# Patient Record
Sex: Male | Born: 1990 | Race: Black or African American | Hispanic: No | Marital: Single | Smoking: Former smoker
Health system: Southern US, Community
[De-identification: ages and names within clinical notes are randomized; demographics above are authoritative.]

## PROBLEM LIST (undated history)

## (undated) ENCOUNTER — Ambulatory Visit: Payer: Medicaid Other | Source: Home / Self Care

## (undated) DIAGNOSIS — T148XXA Other injury of unspecified body region, initial encounter: Secondary | ICD-10-CM

## (undated) DIAGNOSIS — Z789 Other specified health status: Secondary | ICD-10-CM

## (undated) HISTORY — DX: Other injury of unspecified body region, initial encounter: T14.8XXA

## (undated) HISTORY — PX: NO PAST SURGERIES: SHX2092

---
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
## 2010-05-04 ENCOUNTER — Emergency Department (HOSPITAL_COMMUNITY): Admission: EM | Admit: 2010-05-04 | Discharge: 2010-05-04 | Payer: Self-pay | Admitting: Emergency Medicine

## 2010-10-20 ENCOUNTER — Emergency Department (HOSPITAL_COMMUNITY)
Admission: EM | Admit: 2010-10-20 | Discharge: 2010-10-20 | Payer: Self-pay | Source: Home / Self Care | Admitting: Emergency Medicine

## 2011-01-27 LAB — POCT I-STAT, CHEM 8
BUN: 8 mg/dL (ref 6–23)
Calcium, Ion: 1.15 mmol/L (ref 1.12–1.32)
Chloride: 104 mEq/L (ref 96–112)
Creatinine, Ser: 1 mg/dL (ref 0.4–1.5)
Glucose, Bld: 84 mg/dL (ref 70–99)
HCT: 45 % (ref 39.0–52.0)
Hemoglobin: 15.3 g/dL (ref 13.0–17.0)
Potassium: 3.8 mEq/L (ref 3.5–5.1)
Sodium: 140 mEq/L (ref 135–145)
TCO2: 27 mmol/L (ref 0–100)

## 2011-04-01 ENCOUNTER — Emergency Department (HOSPITAL_COMMUNITY)
Admission: EM | Admit: 2011-04-01 | Discharge: 2011-04-01 | Disposition: A | Discharge status: Home / Self Care | Payer: Medicaid Other | Attending: Emergency Medicine | Admitting: Emergency Medicine

## 2011-04-01 DIAGNOSIS — S01501A Unspecified open wound of lip, initial encounter: Secondary | ICD-10-CM | POA: Insufficient documentation

## 2011-04-01 DIAGNOSIS — Y9361 Activity, american tackle football: Secondary | ICD-10-CM | POA: Insufficient documentation

## 2011-04-01 DIAGNOSIS — W219XXA Striking against or struck by unspecified sports equipment, initial encounter: Secondary | ICD-10-CM | POA: Insufficient documentation

## 2011-04-01 DIAGNOSIS — K1370 Unspecified lesions of oral mucosa: Secondary | ICD-10-CM | POA: Insufficient documentation
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
## 2011-04-04 NOTE — Discharge Summary (Signed)
Oscar Guerrero, DEVONSHIRE NO.:  [id number]   MEDICAL RECORD NO.:  [id number]          PATIENT TYPE:  INP   LOCATION:  6148                         FACILITY:  [facility]   PHYSICIAN:  Gerrianne Scale, M.D.DATE OF BIRTH:  Apr 11, 1991   DATE OF ADMISSION:  09/13/2005  DATE OF DISCHARGE:  09/15/2005                                 DISCHARGE SUMMARY   CONSULTS:  Dr. Beverly Milch   DATE OF TRANSFER:  September 15, 2005   DESTINATION OF TRANSFER:  [facility]   ACCEPTING PHYSICIAN:  Dr. Beverly Milch   REASON FOR HOSPITALIZATION:  Patient is a 20 year old boy with history of  PTSD and in foster care most of his life who presented with likely acute  ingestion of unknown medication, suspected to be Seroquel and/or DDAVP,  medications the patient was presently taking.  [name] mother stated that the  patient was kicked out of an after school program because he told another  student he was going to bring a gun.  When the foster mother picked him up  he was very upset.  They had verbal altercation which resulted in the  patient breaking into his medication box and taking an unknown quantity of  one or both of his previously listed medications.  The patient proceeded to  have decreased alertness (mom said patient was out of it).  Paramedics  were called and he was transferred to [facility].  In  the emergency department laboratory evaluations were negative with regards  to CBC, chem-7, urine drug screen, and urinalysis.  Creatinine levels were  elevated into the 1000-2000 level and patient was confused and combative.   HOSPITAL COURSE:  Patient was admitted to the pediatrics floor n.p.o. and  given IV hydration.  EKG showed a normal QT.  There was no evidence of  hypotension.  Patient was monitored for 24 hours on CR monitor with regular  vitals.  Lithium and acetaminophen levels were negative.  Patient's mental  status  slowly improved and a psych consult was called.  Psychiatry saw the  patient and presented a working diagnosis of bipolar disorder with recent  destabilization.  Also noted were manic breakthrough symptoms refractory to  therapy and active suicidal ideation.  Patient was placed on continuous  watch and arrangements were made for patient to be transferred to [facility].  [facility] accepted the patient, the accepting  attending was Dr. Beverly Milch.  Patient was transferred on hospital day  #3 hemodynamically stable, afebrile with an improved level of awareness, and  medically stable.   FINAL DIAGNOSES:  1.  Ingestion - Seroquel and DDAVP likely.  2.  Bipolar disorder with manic breakthroughs.  3.  Suicidal ideation.  4.  Voluntary commitment to psychiatric facility.   DISCHARGE MEDICATIONS AND INSTRUCTIONS:  Patient was transferred to [facility] with Dr. Beverly Milch.   PENDING RESULTS AND ISSUES TO BE FOLLOWED:  The patient is medically stable.  No follow-up laboratories or other tests pending.   DISCHARGE WEIGHT:  72 kg.   CONDITION ON DISCHARGE:  Medically stable for transfer to [facility].   ADDENDUM:  Hospital course of neurologic examinations:  On admission patient  was drowsy and appeared sedated.  This was deemed consistent with the  Seroquel overdose.  Concerning side effects including hypokalemia, heart  block, prolonged QTc, and airway compromise were not observed.  Hospital day  #2 patient was found to be more alert, but continued to be confused.  Patient had been afebrile with stable vitals.  Head CT was attempted but  patient was agitated and CT could not be performed.  Patient's mental status  continued to improve on hospital day #2.  Hospital day #3 patient improved  again.  Was able to discuss his situation.  He was oriented to date, not day  of the week.  Was not oriented to situation.  Had no  insight as to where he  was and why he was there.  Patient was still intermittently sleepy but this  was deemed secondary to being up almost all night the night before.  Of  note, also is that when patient did finally become alert he was very  paranoid in conversations with staff and was evidently quite concerned about  people knowing his business and knowing too much about him.  During his stay  here patient was noted to be combative with the staff, but did not attempt  to be physically violent.  Nevertheless, given his initial combative nature  patient was maintained on 4-point restraints throughout the duration of his  stay.  Given the restraints patient was maintained on IV fluids throughout  the first 24-36 hours of his stay and was transitioned to p.o. fluids during  the last 24 hours of his stay without incident.      [name], M.D.    ______________________________  Gerrianne Scale, M.D.    JP/MEDQ  D:  09/15/2005  T:  09/15/2005  Job:  [id number]
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
## 2012-06-01 ENCOUNTER — Observation Stay (HOSPITAL_COMMUNITY)
Admission: EM | Admit: 2012-06-01 | Discharge: 2012-06-01 | Disposition: A | Discharge status: Home / Self Care | Payer: Medicaid Other | Source: Home / Self Care | Attending: Emergency Medicine | Admitting: Emergency Medicine

## 2012-06-01 ENCOUNTER — Inpatient Hospital Stay (HOSPITAL_COMMUNITY): Payer: Medicaid Other

## 2012-06-01 ENCOUNTER — Emergency Department (HOSPITAL_COMMUNITY): Payer: Medicaid Other

## 2012-06-01 ENCOUNTER — Emergency Department (HOSPITAL_COMMUNITY): Payer: Medicaid Other | Attending: Emergency Medicine

## 2012-06-01 ENCOUNTER — Encounter (HOSPITAL_COMMUNITY): Payer: Self-pay | Admitting: Radiology

## 2012-06-01 DIAGNOSIS — S21309A Unspecified open wound of unspecified front wall of thorax with penetration into thoracic cavity, initial encounter: Secondary | ICD-10-CM | POA: Insufficient documentation

## 2012-06-01 DIAGNOSIS — S21119A Laceration without foreign body of unspecified front wall of thorax without penetration into thoracic cavity, initial encounter: Secondary | ICD-10-CM | POA: Diagnosis present

## 2012-06-01 DIAGNOSIS — Y998 Other external cause status: Secondary | ICD-10-CM | POA: Insufficient documentation

## 2012-06-01 DIAGNOSIS — S31119A Laceration without foreign body of abdominal wall, unspecified quadrant without penetration into peritoneal cavity, initial encounter: Secondary | ICD-10-CM | POA: Diagnosis present

## 2012-06-01 DIAGNOSIS — S36114A Minor laceration of liver, initial encounter: Secondary | ICD-10-CM | POA: Insufficient documentation

## 2012-06-01 DIAGNOSIS — S31109A Unspecified open wound of abdominal wall, unspecified quadrant without penetration into peritoneal cavity, initial encounter: Secondary | ICD-10-CM

## 2012-06-01 DIAGNOSIS — S2190XA Unspecified open wound of unspecified part of thorax, initial encounter: Secondary | ICD-10-CM | POA: Insufficient documentation

## 2012-06-01 DIAGNOSIS — J939 Pneumothorax, unspecified: Secondary | ICD-10-CM | POA: Diagnosis present

## 2012-06-01 DIAGNOSIS — S21109A Unspecified open wound of unspecified front wall of thorax without penetration into thoracic cavity, initial encounter: Secondary | ICD-10-CM

## 2012-06-01 HISTORY — DX: Other specified health status: Z78.9

## 2012-06-01 LAB — TYPE AND SCREEN
Antibody Screen: NEGATIVE
Unit division: 0
Unit division: 0

## 2012-06-01 LAB — COMPREHENSIVE METABOLIC PANEL
ALT: 18 U/L (ref 0–53)
AST: 32 U/L (ref 0–37)
Alkaline Phosphatase: 74 U/L (ref 39–117)
CO2: 12 mEq/L — ABNORMAL LOW (ref 19–32)
Chloride: 99 mEq/L (ref 96–112)
Creatinine, Ser: 1.2 mg/dL (ref 0.50–1.35)
GFR calc non Af Amer: 86 mL/min — ABNORMAL LOW (ref >90)
Potassium: 3.3 mEq/L — ABNORMAL LOW (ref 3.5–5.1)
Total Bilirubin: 0.3 mg/dL (ref 0.3–1.2)

## 2012-06-01 LAB — URINALYSIS, MICROSCOPIC ONLY
Bilirubin Urine: NEGATIVE
Ketones, ur: NEGATIVE mg/dL
Leukocytes, UA: NEGATIVE
Nitrite: NEGATIVE
Protein, ur: NEGATIVE mg/dL

## 2012-06-01 LAB — POCT I-STAT, CHEM 8
BUN: 12 mg/dL (ref 6–23)
Creatinine, Ser: 1.4 mg/dL — ABNORMAL HIGH (ref 0.50–1.35)
Glucose, Bld: 98 mg/dL (ref 70–99)
Hemoglobin: 16.7 g/dL (ref 13.0–17.0)
Potassium: 3.3 mEq/L — ABNORMAL LOW (ref 3.5–5.1)
Sodium: 140 mEq/L (ref 135–145)
TCO2: 12 mmol/L (ref 0–100)

## 2012-06-01 LAB — CBC
MCH: 33.5 pg (ref 26.0–34.0)
MCH: 33.3 pg (ref 26.0–34.0)
MCH: 33.4 pg (ref 26.0–34.0)
MCHC: 34.6 g/dL (ref 30.0–36.0)
MCHC: 35.3 g/dL (ref 30.0–36.0)
MCV: 96.8 fL (ref 78.0–100.0)
MCV: 94.1 fL (ref 78.0–100.0)
Platelets: 153 10*3/uL (ref 150–400)
Platelets: 134 10*3/uL — ABNORMAL LOW (ref 150–400)
Platelets: 136 10*3/uL — ABNORMAL LOW (ref 150–400)
RBC: 4.75 MIL/uL (ref 4.22–5.81)
RDW: 11.9 % (ref 11.5–15.5)
RDW: 11.8 % (ref 11.5–15.5)
WBC: 12.2 10*3/uL — ABNORMAL HIGH (ref 4.0–10.5)

## 2012-06-01 LAB — RAPID URINE DRUG SCREEN, HOSP PERFORMED
Amphetamines: NOT DETECTED
Benzodiazepines: NOT DETECTED
Opiates: POSITIVE — AB

## 2012-06-01 LAB — BASIC METABOLIC PANEL
Calcium: 8.7 mg/dL (ref 8.4–10.5)
Creatinine, Ser: 0.91 mg/dL (ref 0.50–1.35)
GFR calc Af Amer: >90 mL/min (ref >90)

## 2012-06-01 LAB — ABO/RH: ABO/RH(D): O POS

## 2012-06-01 LAB — PROTIME-INR: INR: 1.11 (ref 0.00–1.49)

## 2012-06-01 LAB — ETHANOL: Alcohol, Ethyl (B): 48 mg/dL — ABNORMAL HIGH (ref 0–11)

## 2012-06-01 MED ORDER — ONDANSETRON HCL 4 MG/2ML IJ SOLN: 4.0000 mg | Freq: Four times a day (QID) | INTRAMUSCULAR | Status: DC | PRN

## 2012-06-01 MED ORDER — ONDANSETRON HCL 4 MG PO TABS: 4.0000 mg | ORAL_TABLET | Freq: Four times a day (QID) | ORAL | Status: DC | PRN

## 2012-06-01 MED ORDER — TRAMADOL HCL 50 MG PO TABS: 50.0000 mg | ORAL_TABLET | Freq: Four times a day (QID) | ORAL | Status: AC | PRN

## 2012-06-01 MED ORDER — NICOTINE 21 MG/24HR TD PT24
21.0000 mg | MEDICATED_PATCH | Freq: Every day | TRANSDERMAL | Status: DC
  Filled 2012-06-01: qty 1

## 2012-06-01 MED ORDER — WHITE PETROLATUM GEL
Status: AC
  Filled 2012-06-01: qty 5

## 2012-06-01 MED ORDER — IOHEXOL 300 MG/ML  SOLN
100.0000 mL | Freq: Once | INTRAMUSCULAR | Status: AC | PRN
  Administered 2012-06-01: 100 mL via INTRAVENOUS

## 2012-06-01 MED ORDER — MORPHINE SULFATE 2 MG/ML IJ SOLN: 2.0000 mg | INTRAMUSCULAR | Status: DC | PRN

## 2012-06-01 MED ORDER — CEFAZOLIN SODIUM 1-5 GM-% IV SOLN
1.0000 g | Freq: Three times a day (TID) | INTRAVENOUS | Status: DC
  Administered 2012-06-01 (×2): 1 g via INTRAVENOUS
  Filled 2012-06-01 (×4): qty 50

## 2012-06-01 MED ORDER — KCL IN DEXTROSE-NACL 20-5-0.45 MEQ/L-%-% IV SOLN
INTRAVENOUS | Status: DC
  Administered 2012-06-01: 02:00:00 via INTRAVENOUS
  Filled 2012-06-01 (×4): qty 1000

## 2012-06-01 MED ORDER — MORPHINE SULFATE 2 MG/ML IJ SOLN
1.0000 mg | INTRAMUSCULAR | Status: DC | PRN
  Administered 2012-06-01 (×2): 2 mg via INTRAVENOUS
  Filled 2012-06-01 (×2): qty 1

## 2012-06-01 MED ORDER — PANTOPRAZOLE SODIUM 40 MG PO TBEC: 40.0000 mg | DELAYED_RELEASE_TABLET | Freq: Every day | ORAL | Status: DC

## 2012-06-01 MED ORDER — TRAMADOL HCL 50 MG PO TABS
50.0000 mg | ORAL_TABLET | Freq: Four times a day (QID) | ORAL | Status: DC | PRN
  Administered 2012-06-01: 100 mg via ORAL
  Filled 2012-06-01: qty 2

## 2012-06-01 MED ORDER — PANTOPRAZOLE SODIUM 40 MG IV SOLR
40.0000 mg | Freq: Every day | INTRAVENOUS | Status: DC
  Filled 2012-06-01: qty 40

## 2012-06-01 NOTE — Discharge Summary (Signed)
Sheri Prows, MD, MPH, FACS Pager: [phone]  
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
## 2012-06-01 NOTE — Progress Notes (Signed)
UR complete 

## 2012-06-01 NOTE — ED Notes (Signed)
Patient having increased shortness of breath, feeling like he can't breath.  Patient placed on NRB at 15 liters.

## 2012-06-01 NOTE — ED Notes (Signed)
Returned from CT.

## 2012-06-01 NOTE — ED Notes (Signed)
Blood arrived at this time

## 2012-06-01 NOTE — ED Notes (Signed)
Patient transported to CT with RN and ED Tech.
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
## 2012-06-01 NOTE — Progress Notes (Signed)
Patient transferred to 6N 12C from radiology.  Report called to RN on 6N and all questions answered; VVS.  Patient transferred via wheelchair with radiology transport and family aware of new room assignment.
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
## 2012-06-01 NOTE — Progress Notes (Signed)
Hope for discharge this afternoon. Chest x-ray pending. Patient examined and I agree with the assessment and plan  Violeta Gelinas, MD, MPH, FACS Pager: [phone]  06/01/2012 3:22 PM

## 2012-06-01 NOTE — Progress Notes (Signed)
Discharge instructions reviewed with patient. Verbalizes understanding. Pt asked about his clothing. No documentation noted. Encouraged to follow-up with [employer]. Patient discharged to home.

## 2012-06-01 NOTE — Progress Notes (Signed)
CHAPLAIN NOTE: 1250am. Responded to trauma page. Spoke with staff for background. Pt being actively treated. No family to be notified at present. 100am  210am Checked back in on patient who had been moved to [id number]. Escorted Foster Father to [phone] and coordinated with staff for him to visit pt once he is settled into room. 220am.
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
## 2012-06-10 ENCOUNTER — Encounter (INDEPENDENT_AMBULATORY_CARE_PROVIDER_SITE_OTHER): Payer: Self-pay

## 2012-06-10 ENCOUNTER — Ambulatory Visit (INDEPENDENT_AMBULATORY_CARE_PROVIDER_SITE_OTHER): Payer: Medicaid Other | Admitting: Orthopedic Surgery

## 2012-06-10 VITALS — BP 122/86 | HR 76 | Temp 98.0°F | Resp 16 | Ht 69.0 in | Wt 149.6 lb

## 2012-06-10 DIAGNOSIS — S31119A Laceration without foreign body of abdominal wall, unspecified quadrant without penetration into peritoneal cavity, initial encounter: Secondary | ICD-10-CM

## 2012-06-10 DIAGNOSIS — S31109A Unspecified open wound of abdominal wall, unspecified quadrant without penetration into peritoneal cavity, initial encounter: Secondary | ICD-10-CM

## 2012-06-10 DIAGNOSIS — J939 Pneumothorax, unspecified: Secondary | ICD-10-CM

## 2012-06-10 DIAGNOSIS — S21119A Laceration without foreign body of unspecified front wall of thorax without penetration into thoracic cavity, initial encounter: Secondary | ICD-10-CM

## 2012-06-10 DIAGNOSIS — S21109A Unspecified open wound of unspecified front wall of thorax without penetration into thoracic cavity, initial encounter: Secondary | ICD-10-CM

## 2012-06-10 DIAGNOSIS — J9383 Other pneumothorax: Secondary | ICD-10-CM

## 2012-06-10 DIAGNOSIS — S36114A Minor laceration of liver, initial encounter: Secondary | ICD-10-CM
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
## 2012-06-10 NOTE — Patient Instructions (Signed)
Apply antibiotic ointment (e.g. Neosporin) to wounds twice daily and as needed to keep moist.  Avoid vigorous activity for another month.
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
## 2013-09-10 ENCOUNTER — Emergency Department (HOSPITAL_COMMUNITY): Payer: Medicaid Other

## 2013-09-10 ENCOUNTER — Encounter (HOSPITAL_COMMUNITY): Payer: Self-pay | Admitting: Emergency Medicine

## 2013-09-10 ENCOUNTER — Emergency Department (HOSPITAL_COMMUNITY)
Admission: EM | Admit: 2013-09-10 | Discharge: 2013-09-10 | Disposition: A | Discharge status: Home / Self Care | Payer: Self-pay | Attending: Emergency Medicine | Admitting: Emergency Medicine

## 2013-09-10 DIAGNOSIS — F17200 Nicotine dependence, unspecified, uncomplicated: Secondary | ICD-10-CM | POA: Insufficient documentation

## 2013-09-10 DIAGNOSIS — Z87828 Personal history of other (healed) physical injury and trauma: Secondary | ICD-10-CM | POA: Insufficient documentation

## 2013-09-10 DIAGNOSIS — N200 Calculus of kidney: Secondary | ICD-10-CM | POA: Insufficient documentation

## 2013-09-10 LAB — CBC WITH DIFFERENTIAL/PLATELET
Basophils Absolute: 0 10*3/uL (ref 0.0–0.1)
Basophils Relative: 0 % (ref 0–1)
Eosinophils Absolute: 0 10*3/uL (ref 0.0–0.7)
Eosinophils Relative: 0 % (ref 0–5)
HCT: 40.8 % (ref 39.0–52.0)
Hemoglobin: 14.4 g/dL (ref 13.0–17.0)
Lymphocytes Relative: 12 % (ref 12–46)
Lymphs Abs: 0.9 10*3/uL (ref 0.7–4.0)
MCH: 32.9 pg (ref 26.0–34.0)
MCHC: 35.3 g/dL (ref 30.0–36.0)
MCV: 93.2 fL (ref 78.0–100.0)
Monocytes Absolute: 0.8 10*3/uL (ref 0.1–1.0)
Monocytes Relative: 10 % (ref 3–12)
Neutro Abs: 5.8 10*3/uL (ref 1.7–7.7)
Neutrophils Relative %: 78 % — ABNORMAL HIGH (ref 43–77)
Platelets: 116 10*3/uL — ABNORMAL LOW (ref 150–400)
RBC: 4.38 MIL/uL (ref 4.22–5.81)
RDW: 11.3 % — ABNORMAL LOW (ref 11.5–15.5)
WBC: 7.5 10*3/uL (ref 4.0–10.5)

## 2013-09-10 LAB — URINE MICROSCOPIC-ADD ON

## 2013-09-10 LAB — URINALYSIS, ROUTINE W REFLEX MICROSCOPIC
Bilirubin Urine: NEGATIVE
Glucose, UA: NEGATIVE mg/dL
Ketones, ur: NEGATIVE mg/dL
Nitrite: NEGATIVE
Protein, ur: 100 mg/dL — AB
Specific Gravity, Urine: 1.029 (ref 1.005–1.030)
Urobilinogen, UA: 1 mg/dL (ref 0.0–1.0)
pH: 6.5 (ref 5.0–8.0)

## 2013-09-10 LAB — COMPREHENSIVE METABOLIC PANEL
ALT: 11 U/L (ref 0–53)
AST: 22 U/L (ref 0–37)
Albumin: 4 g/dL (ref 3.5–5.2)
Alkaline Phosphatase: 71 U/L (ref 39–117)
BUN: 12 mg/dL (ref 6–23)
CO2: 26 mEq/L (ref 19–32)
Calcium: 9.5 mg/dL (ref 8.4–10.5)
Chloride: 99 mEq/L (ref 96–112)
Creatinine, Ser: 0.96 mg/dL (ref 0.50–1.35)
GFR calc Af Amer: >90 mL/min (ref >90)
GFR calc non Af Amer: >90 mL/min (ref >90)
Glucose, Bld: 85 mg/dL (ref 70–99)
Potassium: 3.2 mEq/L — ABNORMAL LOW (ref 3.5–5.1)
Sodium: 135 mEq/L (ref 135–145)
Total Bilirubin: 0.5 mg/dL (ref 0.3–1.2)
Total Protein: 7.2 g/dL (ref 6.0–8.3)

## 2013-09-10 MED ORDER — SODIUM CHLORIDE 0.9 % IV BOLUS (SEPSIS)
1000.0000 mL | Freq: Once | INTRAVENOUS | Status: AC
  Administered 2013-09-10: 1000 mL via INTRAVENOUS

## 2013-09-10 MED ORDER — ONDANSETRON HCL 4 MG/2ML IJ SOLN
4.0000 mg | Freq: Once | INTRAMUSCULAR | Status: AC
  Administered 2013-09-10: 4 mg via INTRAVENOUS
  Filled 2013-09-10: qty 2

## 2013-09-10 MED ORDER — HYDROMORPHONE HCL PF 1 MG/ML IJ SOLN
1.0000 mg | Freq: Once | INTRAMUSCULAR | Status: AC
  Administered 2013-09-10: 1 mg via INTRAVENOUS
  Filled 2013-09-10: qty 1

## 2013-09-10 MED ORDER — KETOROLAC TROMETHAMINE 15 MG/ML IJ SOLN
15.0000 mg | Freq: Once | INTRAMUSCULAR | Status: AC
  Administered 2013-09-10: 15 mg via INTRAVENOUS
  Filled 2013-09-10: qty 1

## 2013-09-10 MED ORDER — HYDROCODONE-ACETAMINOPHEN 5-325 MG PO TABS: 1.0000 | ORAL_TABLET | Freq: Four times a day (QID) | ORAL | Status: DC | PRN

## 2013-09-10 NOTE — ED Notes (Signed)
Pt friend Chrishawn asked to be contacted with any changes to pt. [phone]
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
## 2013-09-10 NOTE — ED Notes (Signed)
He c/o intermittant llq area abd. Pain x 2 weeks.  He is in no distress. He denies fever/n/v/d/dysuria.
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
## 2014-11-26 ENCOUNTER — Encounter (HOSPITAL_COMMUNITY): Payer: Self-pay | Admitting: Emergency Medicine

## 2014-11-26 ENCOUNTER — Emergency Department (HOSPITAL_COMMUNITY): Payer: Medicaid Other

## 2014-11-26 ENCOUNTER — Emergency Department (HOSPITAL_COMMUNITY)
Admission: EM | Admit: 2014-11-26 | Discharge: 2014-11-26 | Disposition: A | Discharge status: Home / Self Care | Payer: Medicaid Other | Attending: Emergency Medicine | Admitting: Emergency Medicine

## 2014-11-26 DIAGNOSIS — M791 Myalgia, unspecified site: Secondary | ICD-10-CM

## 2014-11-26 DIAGNOSIS — B349 Viral infection, unspecified: Secondary | ICD-10-CM | POA: Insufficient documentation

## 2014-11-26 DIAGNOSIS — Z720 Tobacco use: Secondary | ICD-10-CM | POA: Insufficient documentation

## 2014-11-26 DIAGNOSIS — Z87828 Personal history of other (healed) physical injury and trauma: Secondary | ICD-10-CM | POA: Insufficient documentation

## 2014-11-26 DIAGNOSIS — R0602 Shortness of breath: Secondary | ICD-10-CM

## 2014-11-26 LAB — CBC WITH DIFFERENTIAL/PLATELET
Basophils Absolute: 0 10*3/uL (ref 0.0–0.1)
Basophils Relative: 0 % (ref 0–1)
Eosinophils Absolute: 0 10*3/uL (ref 0.0–0.7)
Eosinophils Relative: 0 % (ref 0–5)
HCT: 39 % (ref 39.0–52.0)
Hemoglobin: 13.6 g/dL (ref 13.0–17.0)
Lymphocytes Relative: 18 % (ref 12–46)
Lymphs Abs: 1.7 10*3/uL (ref 0.7–4.0)
MCH: 32.5 pg (ref 26.0–34.0)
MCHC: 34.9 g/dL (ref 30.0–36.0)
MCV: 93.3 fL (ref 78.0–100.0)
Monocytes Absolute: 1.8 10*3/uL — ABNORMAL HIGH (ref 0.1–1.0)
Monocytes Relative: 19 % — ABNORMAL HIGH (ref 3–12)
Neutro Abs: 6 10*3/uL (ref 1.7–7.7)
Neutrophils Relative %: 63 % (ref 43–77)
Platelets: 130 10*3/uL — ABNORMAL LOW (ref 150–400)
RBC: 4.18 MIL/uL — ABNORMAL LOW (ref 4.22–5.81)
RDW: 11.9 % (ref 11.5–15.5)
WBC: 9.5 10*3/uL (ref 4.0–10.5)

## 2014-11-26 LAB — COMPREHENSIVE METABOLIC PANEL
ALT: 20 U/L (ref 0–53)
AST: 24 U/L (ref 0–37)
Albumin: 3 g/dL — ABNORMAL LOW (ref 3.5–5.2)
Alkaline Phosphatase: 57 U/L (ref 39–117)
Anion gap: 10 (ref 5–15)
BUN: 6 mg/dL (ref 6–23)
CO2: 23 mmol/L (ref 19–32)
Calcium: 8.8 mg/dL (ref 8.4–10.5)
Chloride: 105 mEq/L (ref 96–112)
Creatinine, Ser: 0.69 mg/dL (ref 0.50–1.35)
GFR calc Af Amer: >90 mL/min (ref >90)
GFR calc non Af Amer: >90 mL/min (ref >90)
Glucose, Bld: 99 mg/dL (ref 70–99)
Potassium: 3.9 mmol/L (ref 3.5–5.1)
Sodium: 138 mmol/L (ref 135–145)
Total Bilirubin: 1 mg/dL (ref 0.3–1.2)
Total Protein: 6.5 g/dL (ref 6.0–8.3)

## 2014-11-26 LAB — LIPASE, BLOOD: Lipase: 20 U/L (ref 11–59)

## 2014-11-26 MED ORDER — ONDANSETRON 4 MG PO TBDP: ORAL_TABLET | ORAL | Status: DC

## 2014-11-26 MED ORDER — SODIUM CHLORIDE 0.9 % IV BOLUS (SEPSIS)
1000.0000 mL | Freq: Once | INTRAVENOUS | Status: AC
  Administered 2014-11-26: 1000 mL via INTRAVENOUS

## 2014-11-26 MED ORDER — ONDANSETRON HCL 4 MG/2ML IJ SOLN
4.0000 mg | Freq: Once | INTRAMUSCULAR | Status: AC
  Administered 2014-11-26: 4 mg via INTRAVENOUS
  Filled 2014-11-26: qty 2

## 2014-11-26 MED ORDER — ONDANSETRON 4 MG PO TBDP: 4.0000 mg | ORAL_TABLET | Freq: Once | ORAL | Status: DC

## 2014-11-26 NOTE — Discharge Instructions (Signed)
Upper Respiratory Infection, Adult An upper respiratory infection (URI) is also sometimes known as the common cold. The upper respiratory tract includes the nose, sinuses, throat, trachea, and bronchi. Bronchi are the airways leading to the lungs. Most people improve within 1 week, but symptoms can last up to 2 weeks. A residual cough may last even longer.  CAUSES Many different viruses can infect the tissues lining the upper respiratory tract. The tissues become irritated and inflamed and often become very moist. Mucus production is also common. A cold is contagious. You can easily spread the virus to others by oral contact. This includes kissing, sharing a glass, coughing, or sneezing. Touching your mouth or nose and then touching a surface, which is then touched by another person, can also spread the virus. SYMPTOMS  Symptoms typically develop 1 to 3 days after you come in contact with a cold virus. Symptoms vary from person to person. They may include:  Runny nose.  Sneezing.  Nasal congestion.  Sinus irritation.  Sore throat.  Loss of voice (laryngitis).  Cough.  Fatigue.  Muscle aches.  Loss of appetite.  Headache.  Low-grade fever. DIAGNOSIS  You might diagnose your own cold based on familiar symptoms, since most people get a cold 2 to 3 times a year. Your caregiver can confirm this based on your exam. Most importantly, your caregiver can check that your symptoms are not due to another disease such as strep throat, sinusitis, pneumonia, asthma, or epiglottitis. Blood tests, throat tests, and X-rays are not necessary to diagnose a common cold, but they may sometimes be helpful in excluding other more serious diseases. Your caregiver will decide if any further tests are required. RISKS AND COMPLICATIONS  You may be at risk for a more severe case of the common cold if you smoke cigarettes, have chronic heart disease (such as heart failure) or lung disease (such as asthma), or if  you have a weakened immune system. The very young and very old are also at risk for more serious infections. Bacterial sinusitis, middle ear infections, and bacterial pneumonia can complicate the common cold. The common cold can worsen asthma and chronic obstructive pulmonary disease (COPD). Sometimes, these complications can require emergency medical care and may be life-threatening. PREVENTION  The best way to protect against getting a cold is to practice good hygiene. Avoid oral or hand contact with people with cold symptoms. Wash your hands often if contact occurs. There is no clear evidence that vitamin C, vitamin E, echinacea, or exercise reduces the chance of developing a cold. However, it is always recommended to get plenty of rest and practice good nutrition. TREATMENT  Treatment is directed at relieving symptoms. There is no cure. Antibiotics are not effective, because the infection is caused by a virus, not by bacteria. Treatment may include:  Increased fluid intake. Sports drinks offer valuable electrolytes, sugars, and fluids.  Breathing heated mist or steam (vaporizer or shower).  Eating chicken soup or other clear broths, and maintaining good nutrition.  Getting plenty of rest.  Using gargles or lozenges for comfort.  Controlling fevers with ibuprofen or acetaminophen as directed by your caregiver.  Increasing usage of your inhaler if you have asthma. Zinc gel and zinc lozenges, taken in the first 24 hours of the common cold, can shorten the duration and lessen the severity of symptoms. Pain medicines may help with fever, muscle aches, and throat pain. A variety of non-prescription medicines are available to treat congestion and runny nose. Your caregiver   can make recommendations and may suggest nasal or lung inhalers for other symptoms.  HOME CARE INSTRUCTIONS   Only take over-the-counter or prescription medicines for pain, discomfort, or fever as directed by your  caregiver.  Use a warm mist humidifier or inhale steam from a shower to increase air moisture. This may keep secretions moist and make it easier to breathe.  Drink enough water and fluids to keep your urine clear or pale yellow.  Rest as needed.  Return to work when your temperature has returned to normal or as your caregiver advises. You may need to stay home longer to avoid infecting others. You can also use a face mask and careful hand washing to prevent spread of the virus. SEEK MEDICAL CARE IF:   After the first few days, you feel you are getting worse rather than better.  You need your caregiver's advice about medicines to control symptoms.  You develop chills, worsening shortness of breath, or brown or red sputum. These may be signs of pneumonia.  You develop yellow or brown nasal discharge or pain in the face, especially when you bend forward. These may be signs of sinusitis.  You develop a fever, swollen neck glands, pain with swallowing, or white areas in the back of your throat. These may be signs of strep throat. SEEK IMMEDIATE MEDICAL CARE IF:   You have a fever.  You develop severe or persistent headache, ear pain, sinus pain, or chest pain.  You develop wheezing, a prolonged cough, cough up blood, or have a change in your usual mucus (if you have chronic lung disease).  You develop sore muscles or a stiff neck. Document Released: 04/29/2001 Document Revised: 01/26/2012 Document Reviewed: 02/08/2014 [employer] Patient Information 2015 [employer]. This information is not intended to replace advice given to you by your health care provider. Make sure you discuss any questions you have with your health care provider.  

## 2014-11-26 NOTE — ED Notes (Signed)
Pt from home with c/o cough, congestion, and generalized body aches x4 days. Pt reports greenish and tan colored sputum from productive cough and reports having night sweats. Currently afebrile at this time. Pt denies any cp, states some sob only when coughing. NAD noted. axox 4.
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
## 2016-10-31 ENCOUNTER — Emergency Department (HOSPITAL_COMMUNITY)
Admission: EM | Admit: 2016-10-31 | Discharge: 2016-10-31 | Disposition: A | Discharge status: Home / Self Care | Payer: Self-pay | Attending: Emergency Medicine | Admitting: Emergency Medicine

## 2016-10-31 ENCOUNTER — Emergency Department (HOSPITAL_COMMUNITY): Payer: Self-pay

## 2016-10-31 ENCOUNTER — Encounter (HOSPITAL_COMMUNITY): Payer: Self-pay

## 2016-10-31 DIAGNOSIS — R000 Tachycardia, unspecified: Secondary | ICD-10-CM | POA: Insufficient documentation

## 2016-10-31 DIAGNOSIS — F17200 Nicotine dependence, unspecified, uncomplicated: Secondary | ICD-10-CM | POA: Insufficient documentation

## 2016-10-31 LAB — CBC WITH DIFFERENTIAL/PLATELET
Basophils Absolute: 0 10*3/uL (ref 0.0–0.1)
Basophils Relative: 0 %
Eosinophils Absolute: 0.1 10*3/uL (ref 0.0–0.7)
Eosinophils Relative: 2 %
HCT: 42.4 % (ref 39.0–52.0)
Hemoglobin: 14.5 g/dL (ref 13.0–17.0)
Lymphocytes Relative: 37 %
Lymphs Abs: 1.6 10*3/uL (ref 0.7–4.0)
MCH: 32.9 pg (ref 26.0–34.0)
MCHC: 34.2 g/dL (ref 30.0–36.0)
MCV: 96.1 fL (ref 78.0–100.0)
Monocytes Absolute: 0.5 10*3/uL (ref 0.1–1.0)
Monocytes Relative: 11 %
Neutro Abs: 2.2 10*3/uL (ref 1.7–7.7)
Neutrophils Relative %: 51 %
Platelets: 105 10*3/uL — ABNORMAL LOW (ref 150–400)
RBC: 4.41 MIL/uL (ref 4.22–5.81)
RDW: 12.1 % (ref 11.5–15.5)
WBC: 4.3 10*3/uL (ref 4.0–10.5)

## 2016-10-31 LAB — I-STAT TROPONIN, ED: Troponin i, poc: 0 ng/mL (ref 0.00–0.08)
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
## 2016-10-31 NOTE — ED Triage Notes (Signed)
Pt presents with report of elevated heart rate by staff at Plasma center.  Pt denies any palpitations, chest pain or shortness of breath.  Staff referred pt here for clearance so he can return for donation.

## 2016-10-31 NOTE — Discharge Instructions (Addendum)
All of your tests imaging has been normal in the ED today. We discussed the findings of EKG. If you develop any symptoms including chest pain, shortness of breath, fever, cough please return to the ED. Please follow-up with the primary care doctor. I also given a referral to a cardiologist that he may use.  From medical standpoint patient is clear to give plasma today. He is follow-up with his primary care doctor and a cardiologist is symptoms persist.

## 2017-12-31 IMAGING — CR DG CHEST 2V
2 series · 2 of 2 positions shown · non-contrast
Comparison: Radiographs November 26, 2014.

CLINICAL DATA: Tachycardia.

EXAM:
CHEST  2 VIEW

[chest pa]
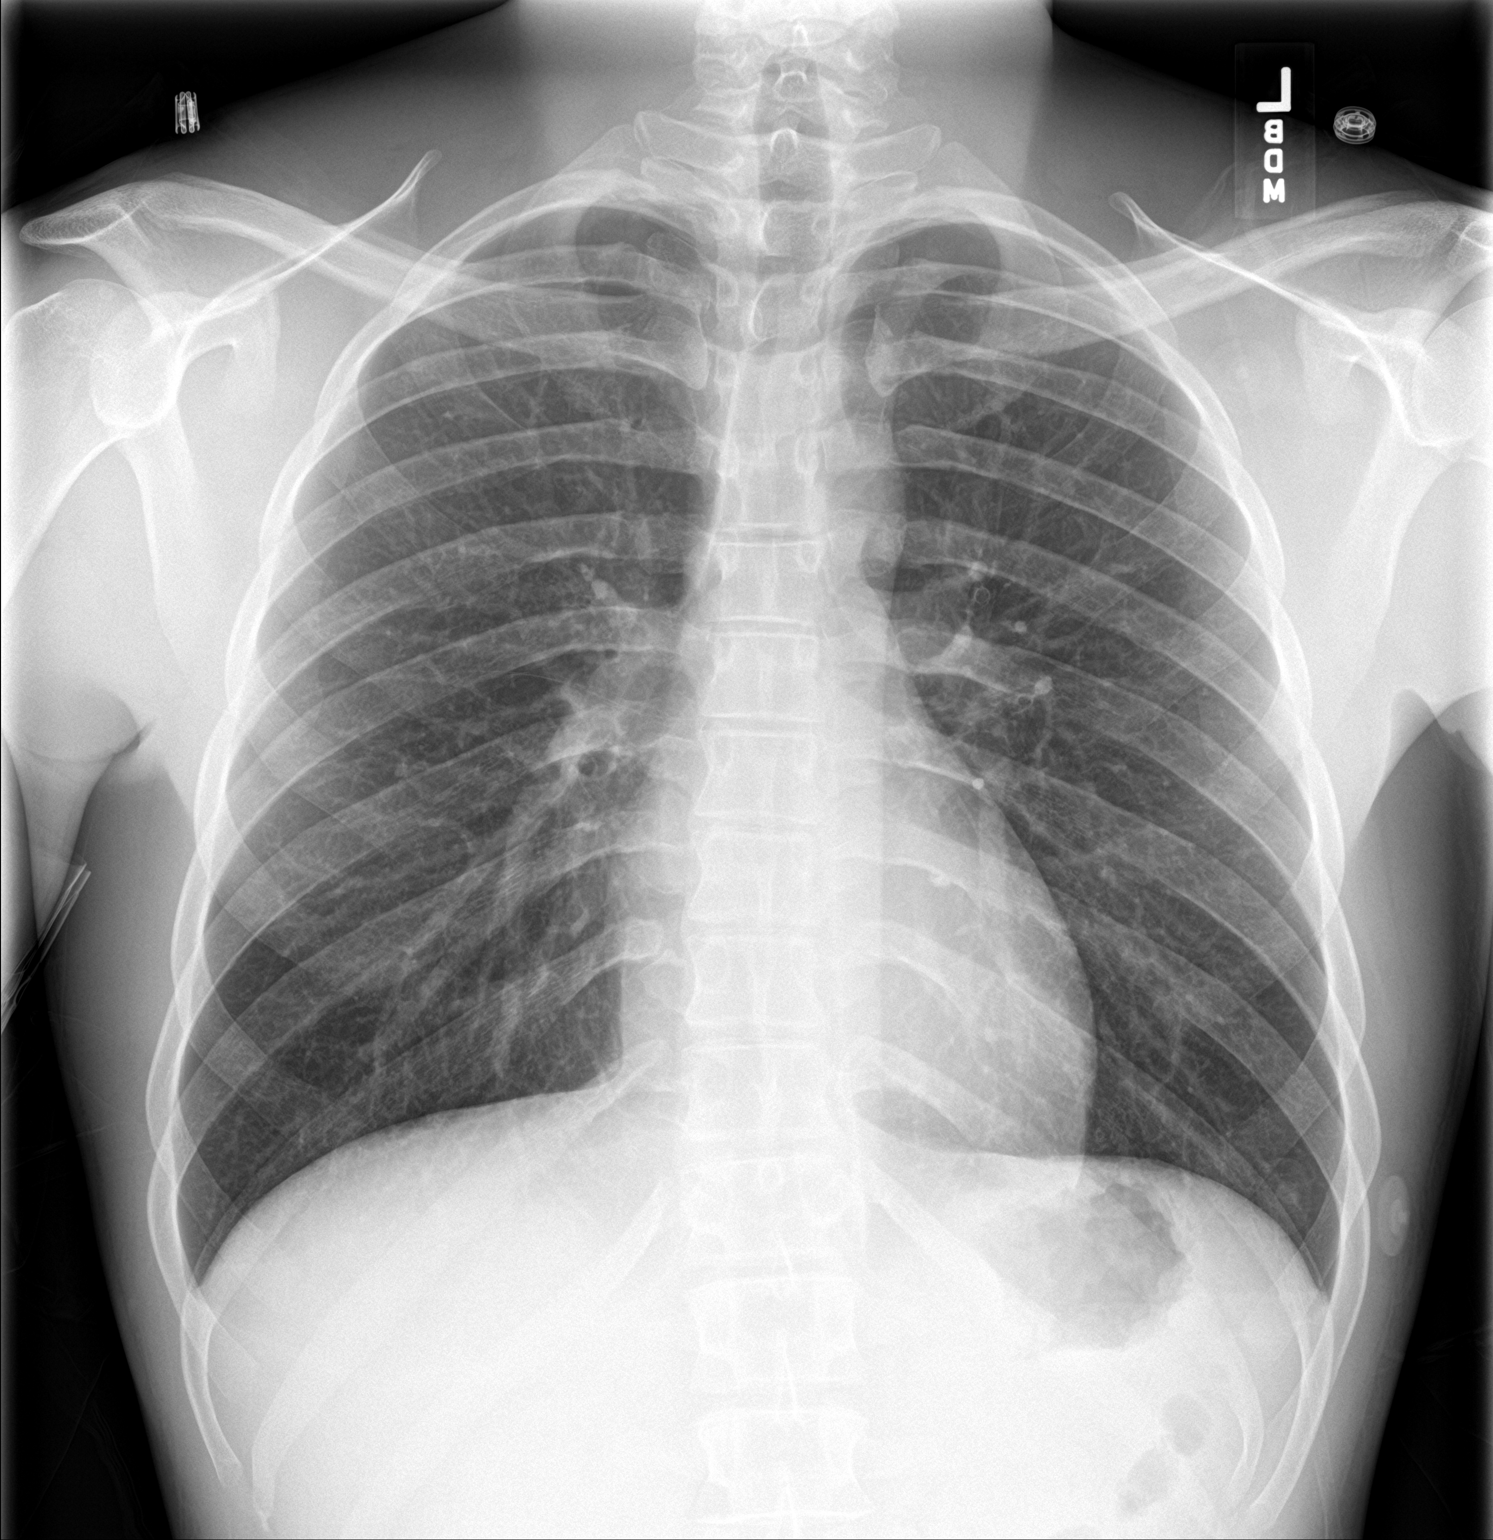

[chest lat]
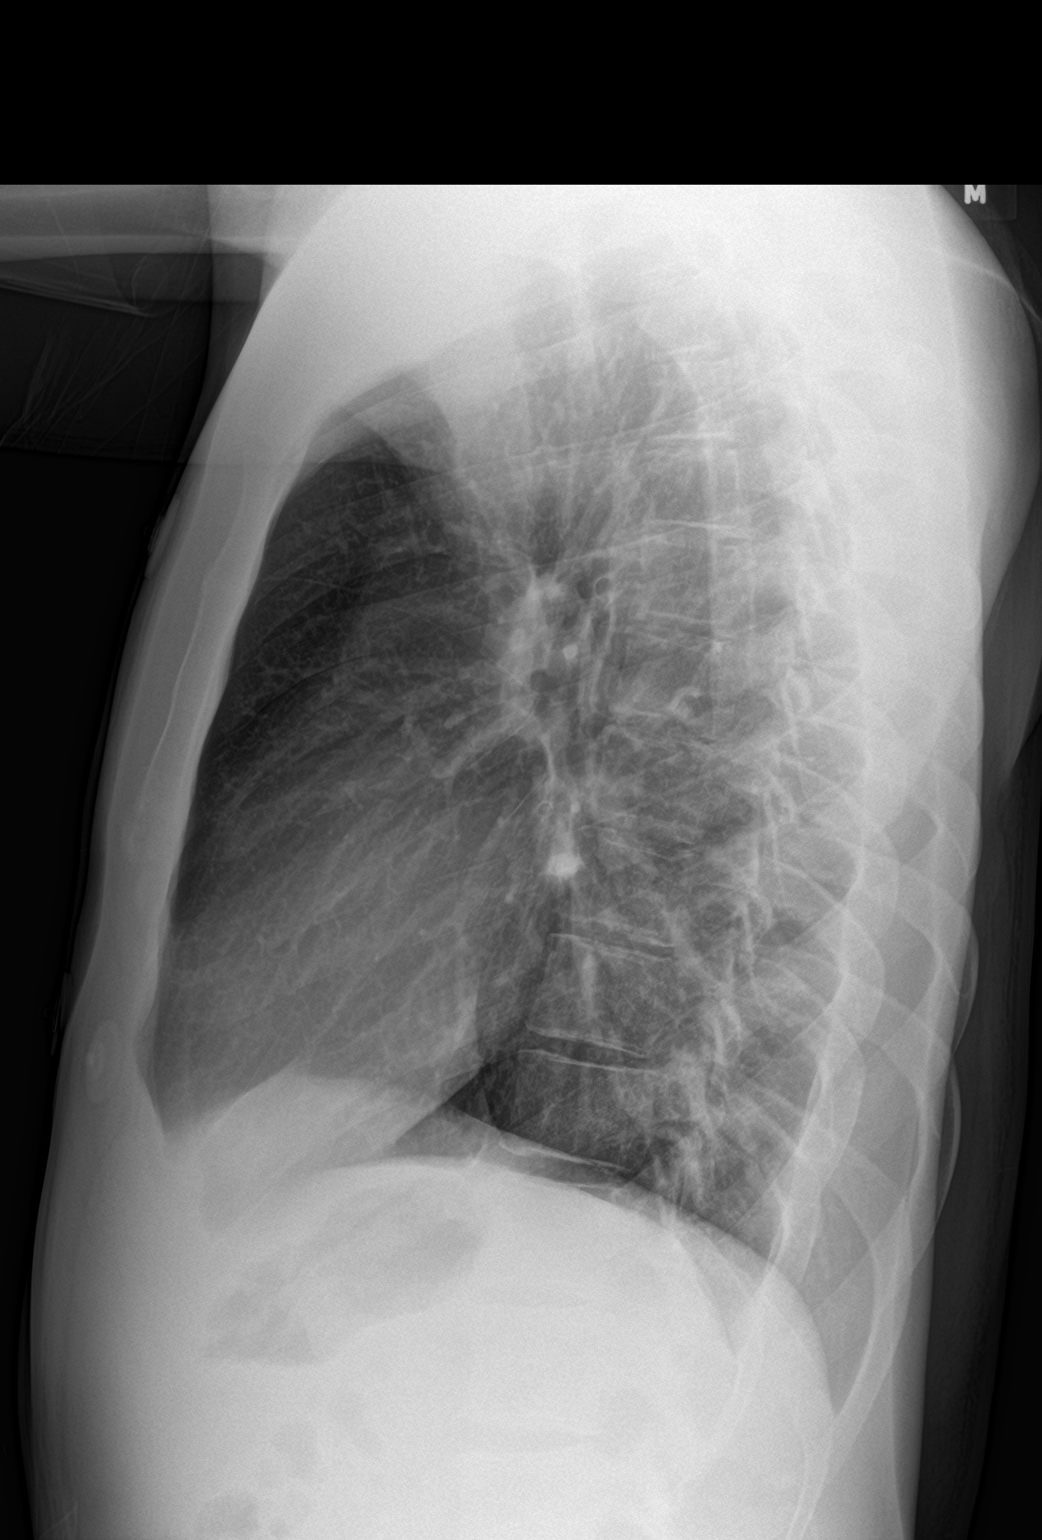

[2 of 2 positions shown; findings below may reference images not displayed]

FINDINGS: The heart size and mediastinal contours are within normal limits.
Both lungs are clear. No pneumothorax or pleural effusion is noted.
The visualized skeletal structures are unremarkable.
IMPRESSION: No active cardiopulmonary disease.

## 2020-08-04 ENCOUNTER — Encounter (HOSPITAL_COMMUNITY): Payer: Self-pay

## 2020-08-04 ENCOUNTER — Emergency Department (HOSPITAL_COMMUNITY)
Admission: EM | Admit: 2020-08-04 | Discharge: 2020-08-04 | Disposition: A | Discharge status: Home / Self Care | Payer: HRSA Program | Attending: Emergency Medicine | Admitting: Emergency Medicine

## 2020-08-04 ENCOUNTER — Other Ambulatory Visit: Payer: Self-pay

## 2020-08-04 ENCOUNTER — Emergency Department (HOSPITAL_COMMUNITY): Payer: HRSA Program

## 2020-08-04 DIAGNOSIS — F17200 Nicotine dependence, unspecified, uncomplicated: Secondary | ICD-10-CM | POA: Insufficient documentation

## 2020-08-04 DIAGNOSIS — U071 COVID-19: Secondary | ICD-10-CM | POA: Diagnosis not present

## 2020-08-04 DIAGNOSIS — R61 Generalized hyperhidrosis: Secondary | ICD-10-CM | POA: Diagnosis present

## 2020-08-04 LAB — BASIC METABOLIC PANEL
Anion gap: 13 (ref 5–15)
BUN: 11 mg/dL (ref 6–20)
CO2: 23 mmol/L (ref 22–32)
Calcium: 9.6 mg/dL (ref 8.9–10.3)
Chloride: 101 mmol/L (ref 98–111)
Creatinine, Ser: 0.88 mg/dL (ref 0.61–1.24)
GFR calc Af Amer: >60 mL/min (ref >60)
GFR calc non Af Amer: >60 mL/min (ref >60)
Glucose, Bld: 103 mg/dL — ABNORMAL HIGH (ref 70–99)
Potassium: 3.8 mmol/L (ref 3.5–5.1)
Sodium: 137 mmol/L (ref 135–145)

## 2020-08-04 LAB — CBC WITH DIFFERENTIAL/PLATELET
Abs Immature Granulocytes: 0.02 10*3/uL (ref 0.00–0.07)
Basophils Absolute: 0 10*3/uL (ref 0.0–0.1)
Basophils Relative: 0 %
Eosinophils Absolute: 0 10*3/uL (ref 0.0–0.5)
Eosinophils Relative: 0 %
HCT: 50.3 % (ref 39.0–52.0)
Hemoglobin: 17.4 g/dL — ABNORMAL HIGH (ref 13.0–17.0)
Immature Granulocytes: 0 %
Lymphocytes Relative: 9 %
Lymphs Abs: 0.5 10*3/uL — ABNORMAL LOW (ref 0.7–4.0)
MCH: 33.8 pg (ref 26.0–34.0)
MCHC: 34.6 g/dL (ref 30.0–36.0)
MCV: 97.7 fL (ref 80.0–100.0)
Monocytes Absolute: 0.9 10*3/uL (ref 0.1–1.0)
Monocytes Relative: 18 %
Neutro Abs: 3.8 10*3/uL (ref 1.7–7.7)
Neutrophils Relative %: 73 %
Platelets: 142 10*3/uL — ABNORMAL LOW (ref 150–400)
RBC: 5.15 MIL/uL (ref 4.22–5.81)
RDW: 11 % — ABNORMAL LOW (ref 11.5–15.5)
WBC: 5.3 10*3/uL (ref 4.0–10.5)
nRBC: 0 % (ref 0.0–0.2)

## 2020-08-04 LAB — SARS CORONAVIRUS 2 BY RT PCR (HOSPITAL ORDER, PERFORMED IN ~~LOC~~ HOSPITAL LAB): SARS Coronavirus 2: POSITIVE — AB

## 2020-08-04 MED ORDER — ACETAMINOPHEN 500 MG PO TABS
1000.0000 mg | ORAL_TABLET | Freq: Once | ORAL | Status: AC
  Administered 2020-08-04: 1000 mg via ORAL
  Filled 2020-08-04: qty 2

## 2020-08-04 MED ORDER — DEXAMETHASONE SODIUM PHOSPHATE 10 MG/ML IJ SOLN
10.0000 mg | Freq: Once | INTRAMUSCULAR | Status: AC
  Administered 2020-08-04: 10 mg via INTRAVENOUS

## 2020-08-04 MED ORDER — DEXAMETHASONE SODIUM PHOSPHATE 10 MG/ML IJ SOLN
10.0000 mg | Freq: Once | INTRAMUSCULAR | Status: DC
  Filled 2020-08-04: qty 1

## 2020-08-04 NOTE — Progress Notes (Signed)
CSW contacted [employer] to arrange transportation to Pts Home. CSW called Pts cell # to obtain verbal permission to sign transportation waiver in Pt's stead. Waiver was stickered and placed in paper record.

## 2020-08-04 NOTE — Discharge Instructions (Addendum)
If you develop any new or worsening symptoms you need to return to the ER for reevaluation.  You can continue to take Tylenol for management of your fevers.  Please follow the instructions on the bottle.  It was a pleasure to meet you.

## 2020-08-04 NOTE — ED Notes (Signed)
Patient stated that he was still unable to give a urine sample
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
## 2020-08-05 ENCOUNTER — Telehealth: Payer: Self-pay | Admitting: Family

## 2020-08-05 NOTE — Telephone Encounter (Signed)
Called to Discuss with patient about Covid symptoms and the use of the monoclonal antibody infusion for those with mild to moderate Covid symptoms and at a high risk of hospitalization.     Pt appears to qualify for this infusion due to co-morbid conditions and/or a member of an at-risk group in accordance with the FDA Emergency Use Authorization.    Unable to reach patient. Home phone no longer in service. Cell phone with VM box full. Sent text via [employer].   Alver Sorrow, NP

## 2021-10-04 IMAGING — DX DG CHEST 1V PORT
1 series · 1 of 1 positions shown · non-contrast
Comparison: 10/31/2016

CLINICAL DATA: Cough with sweating 2 days.  Whole body aches.

EXAM:
PORTABLE CHEST 1 VIEW

[chest ap]
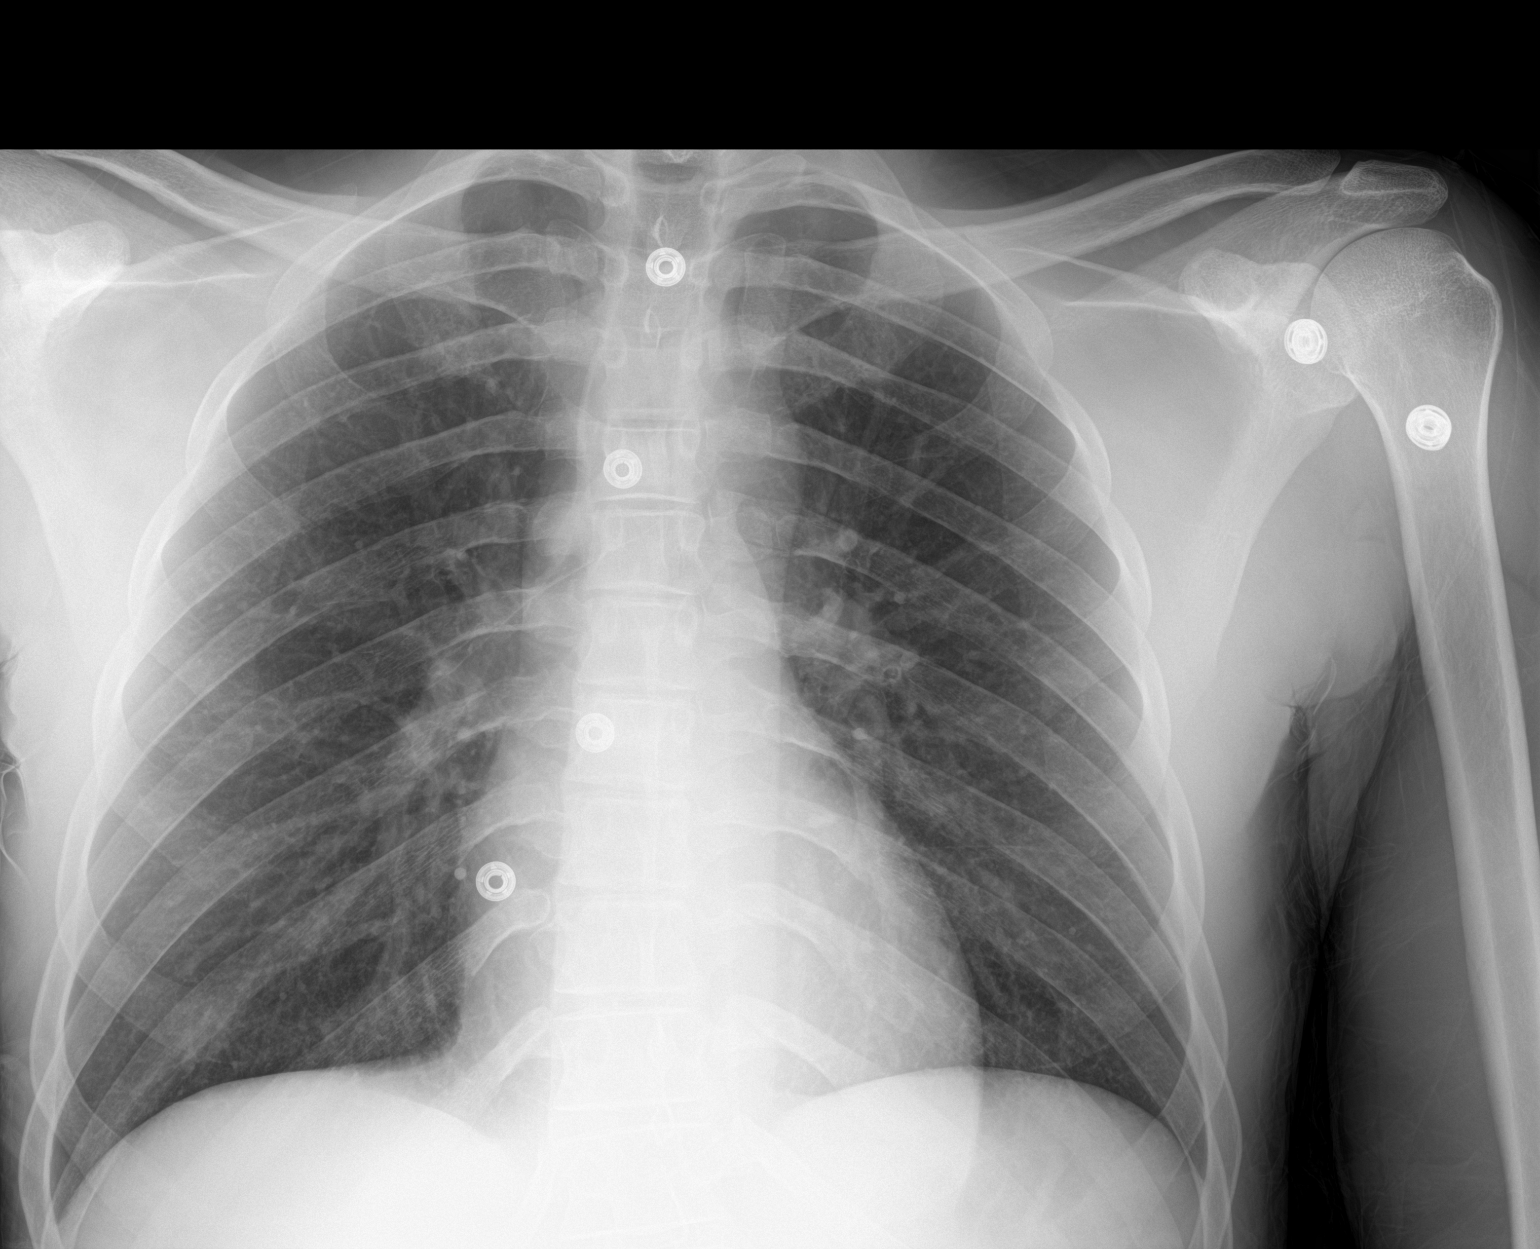

[1 of 1 positions shown; findings below may reference images not displayed]

FINDINGS: Lungs are adequately inflated without effusion or consolidation.
Cardiomediastinal silhouette, bones and soft tissues are normal.
IMPRESSION: No active disease.

## 2022-03-21 ENCOUNTER — Emergency Department (HOSPITAL_COMMUNITY)
Admission: EM | Admit: 2022-03-21 | Discharge: 2022-03-21 | Disposition: A | Discharge status: Home / Self Care | Payer: Self-pay | Attending: Emergency Medicine | Admitting: Emergency Medicine

## 2022-03-21 ENCOUNTER — Other Ambulatory Visit: Payer: Self-pay

## 2022-03-21 ENCOUNTER — Emergency Department (HOSPITAL_COMMUNITY): Payer: Self-pay

## 2022-03-21 DIAGNOSIS — S0083XA Contusion of other part of head, initial encounter: Secondary | ICD-10-CM | POA: Insufficient documentation

## 2022-03-21 DIAGNOSIS — M7989 Other specified soft tissue disorders: Secondary | ICD-10-CM | POA: Insufficient documentation

## 2022-03-21 DIAGNOSIS — R0781 Pleurodynia: Secondary | ICD-10-CM | POA: Insufficient documentation

## 2022-03-21 DIAGNOSIS — M25561 Pain in right knee: Secondary | ICD-10-CM | POA: Insufficient documentation

## 2022-03-21 DIAGNOSIS — M546 Pain in thoracic spine: Secondary | ICD-10-CM | POA: Insufficient documentation

## 2022-03-21 DIAGNOSIS — R109 Unspecified abdominal pain: Secondary | ICD-10-CM | POA: Insufficient documentation

## 2022-03-21 DIAGNOSIS — T148XXA Other injury of unspecified body region, initial encounter: Secondary | ICD-10-CM

## 2022-03-21 DIAGNOSIS — M545 Low back pain, unspecified: Secondary | ICD-10-CM | POA: Insufficient documentation

## 2022-03-21 MED ORDER — IBUPROFEN 800 MG PO TABS
800.0000 mg | ORAL_TABLET | Freq: Once | ORAL | Status: AC
  Administered 2022-03-21: 800 mg via ORAL
  Filled 2022-03-21: qty 1

## 2022-03-21 MED ORDER — ACETAMINOPHEN 325 MG PO TABS: 650.0000 mg | ORAL_TABLET | Freq: Four times a day (QID) | ORAL | 0 refills | Status: AC | PRN

## 2022-03-21 MED ORDER — ACETAMINOPHEN 500 MG PO TABS
1000.0000 mg | ORAL_TABLET | Freq: Once | ORAL | Status: AC
  Administered 2022-03-21: 1000 mg via ORAL
  Filled 2022-03-21: qty 2

## 2022-03-21 MED ORDER — IBUPROFEN 600 MG PO TABS: 600.0000 mg | ORAL_TABLET | Freq: Four times a day (QID) | ORAL | 0 refills | Status: DC | PRN

## 2022-03-21 MED ORDER — CYCLOBENZAPRINE HCL 10 MG PO TABS: 10.0000 mg | ORAL_TABLET | Freq: Two times a day (BID) | ORAL | 0 refills | Status: DC | PRN

## 2022-03-21 MED ORDER — CYCLOBENZAPRINE HCL 10 MG PO TABS
10.0000 mg | ORAL_TABLET | Freq: Once | ORAL | Status: AC
  Administered 2022-03-21: 10 mg via ORAL
  Filled 2022-03-21: qty 1
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
## 2022-11-18 ENCOUNTER — Telehealth: Payer: Self-pay

## 2022-11-18 NOTE — Telephone Encounter (Signed)
No working #. AS, CMA 

## 2022-12-28 ENCOUNTER — Ambulatory Visit: Payer: Medicaid Other

## 2023-07-24 ENCOUNTER — Ambulatory Visit
Admission: EM | Admit: 2023-07-24 | Discharge: 2023-07-24 | Disposition: A | Discharge status: Home / Self Care | Payer: Medicaid Other | Attending: Internal Medicine | Admitting: Internal Medicine

## 2023-07-24 DIAGNOSIS — R590 Localized enlarged lymph nodes: Secondary | ICD-10-CM | POA: Diagnosis not present

## 2023-07-24 DIAGNOSIS — J029 Acute pharyngitis, unspecified: Secondary | ICD-10-CM | POA: Diagnosis not present

## 2023-07-24 LAB — POCT MONO SCREEN (KUC): Mono, POC: NEGATIVE

## 2023-07-24 LAB — POCT RAPID STREP A (OFFICE): Rapid Strep A Screen: NEGATIVE

## 2023-07-24 NOTE — ED Triage Notes (Signed)
Pt states he woke up this morning with left throat pain. States it hurts worse when he swallows. Pt did not take anything at  home for his pain.

## 2023-07-24 NOTE — Discharge Instructions (Signed)
Strep and rapid mono were negative.  Throat culture is pending.  Will call if it is abnormal.  As we discussed, your lymph node is swollen and should resolve in the next few days.  If it does not improve, please follow-up for further evaluation and management.
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
## 2023-07-26 LAB — CULTURE, GROUP A STREP (THRC)

## 2023-09-08 ENCOUNTER — Ambulatory Visit (HOSPITAL_COMMUNITY)
Admission: EM | Admit: 2023-09-08 | Discharge: 2023-09-08 | Disposition: A | Discharge status: Home / Self Care | Payer: Medicaid Other | Attending: Psychiatry | Admitting: Psychiatry

## 2023-09-08 ENCOUNTER — Ambulatory Visit (HOSPITAL_COMMUNITY): Payer: Medicaid Other | Admitting: Physician Assistant

## 2023-09-08 VITALS — BP 134/87 | HR 65 | Ht 69.0 in | Wt 158.2 lb

## 2023-09-08 DIAGNOSIS — F3132 Bipolar disorder, current episode depressed, moderate: Secondary | ICD-10-CM

## 2023-09-08 DIAGNOSIS — Z760 Encounter for issue of repeat prescription: Secondary | ICD-10-CM | POA: Insufficient documentation

## 2023-09-08 DIAGNOSIS — F411 Generalized anxiety disorder: Secondary | ICD-10-CM

## 2023-09-08 DIAGNOSIS — F339 Major depressive disorder, recurrent, unspecified: Secondary | ICD-10-CM | POA: Insufficient documentation

## 2023-09-08 MED ORDER — MIRTAZAPINE 7.5 MG PO TABS: 7.5000 mg | ORAL_TABLET | Freq: Every day | ORAL | 1 refills | Status: DC

## 2023-09-08 MED ORDER — QUETIAPINE FUMARATE 50 MG PO TABS: 50.0000 mg | ORAL_TABLET | Freq: Every day | ORAL | 1 refills | Status: DC

## 2023-09-08 NOTE — ED Notes (Signed)
Patient d/c by provider for scheduled appointment attendance upstairs.

## 2023-09-08 NOTE — Progress Notes (Signed)
   09/08/23 0921  [facility] Triage Screening (Walk-ins at [facility] only)  How Did You Hear About [place]? Family/Friend  What Is the Reason for Your Visit/Call Today? Oscar Guerrero presents to [facility] accompanied by his sister and his sisters boyfriend seeking medication management. Pt was able to obtain an appointment with [facility] at 10am but this writer was informed that the pt would have to be seen in the urgent care prior to seeing a provider in outpatient. Pt reports difficulty sleeping.  Pt reports he has been without medication for 1 year. Pt states he is diagnosed with Schizophrenia, PTSD, Bi-polar disorder, ADHD, and anxiety. Pt reports alcohol use today, 1 beer.Pt denies SI/HI and AVH at this time.  How Long Has This Been Causing You Problems? <Week  Have You Recently Had Any Thoughts About Hurting Yourself? No  Are You Planning to Commit Suicide/Harm Yourself At This time? No  Have you Recently Had Thoughts About Hurting Someone [name]? No  Are You Planning To Harm Someone At This Time? No  Are you currently experiencing any auditory, visual or other hallucinations? No  Have You Used Any Alcohol or Drugs in the Past 24 Hours? Yes  How long ago did you use Drugs or Alcohol? today  What Did You Use and How Much? 1 beer  Do you have any current medical co-morbidities that require immediate attention? No  Clinician description of patient physical appearance/behavior: calm, cooperative  What Do You Feel Would Help You the Most Today? Medication(s)  If access to [facility] was not available, would you have sought care in the Emergency Department? No  Determination of Need Routine (7 days)  Options For Referral Outpatient Therapy;Medication Management
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
## 2023-09-10 ENCOUNTER — Encounter (HOSPITAL_COMMUNITY): Payer: Self-pay | Admitting: Physician Assistant

## 2023-10-20 ENCOUNTER — Encounter (HOSPITAL_COMMUNITY): Payer: Medicaid Other | Admitting: Physician Assistant

## 2023-10-30 ENCOUNTER — Ambulatory Visit (INDEPENDENT_AMBULATORY_CARE_PROVIDER_SITE_OTHER): Payer: Medicaid Other | Admitting: Physician Assistant

## 2023-10-30 DIAGNOSIS — F3132 Bipolar disorder, current episode depressed, moderate: Secondary | ICD-10-CM | POA: Diagnosis not present

## 2023-10-30 DIAGNOSIS — F411 Generalized anxiety disorder: Secondary | ICD-10-CM

## 2023-10-30 MED ORDER — MIRTAZAPINE 7.5 MG PO TABS: 7.5000 mg | ORAL_TABLET | Freq: Every day | ORAL | 1 refills | Status: DC

## 2023-10-30 MED ORDER — QUETIAPINE FUMARATE 100 MG PO TABS: 100.0000 mg | ORAL_TABLET | Freq: Every day | ORAL | 1 refills | Status: DC
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
## 2023-11-01 ENCOUNTER — Encounter (HOSPITAL_COMMUNITY): Payer: Self-pay | Admitting: Physician Assistant

## 2023-12-11 ENCOUNTER — Encounter (HOSPITAL_COMMUNITY): Payer: Medicaid Other | Admitting: Physician Assistant

## 2023-12-24 ENCOUNTER — Encounter (HOSPITAL_COMMUNITY): Payer: Medicaid Other | Admitting: Physician Assistant

## 2024-06-30 ENCOUNTER — Ambulatory Visit
Admission: EM | Admit: 2024-06-30 | Discharge: 2024-06-30 | Disposition: A | Discharge status: Home / Self Care | Attending: Physician Assistant | Admitting: Physician Assistant

## 2024-06-30 ENCOUNTER — Encounter: Payer: Self-pay | Admitting: Emergency Medicine

## 2024-06-30 DIAGNOSIS — J069 Acute upper respiratory infection, unspecified: Secondary | ICD-10-CM

## 2024-06-30 LAB — POC SOFIA SARS ANTIGEN FIA: SARS Coronavirus 2 Ag: NEGATIVE

## 2024-06-30 LAB — POCT RAPID STREP A (OFFICE): Rapid Strep A Screen: NEGATIVE

## 2024-06-30 NOTE — ED Triage Notes (Signed)
 Pt presents c/o sore throat x 3 days. Pt reports he woke up two nights ago with a runny nose, pressure behind his eyes,  and his throat hurting. Pt denies emesis but does c/o possible fever.
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
## 2024-07-04 ENCOUNTER — Ambulatory Visit (HOSPITAL_COMMUNITY): Payer: Self-pay

## 2024-07-04 LAB — CULTURE, GROUP A STREP (THRC)

## 2024-07-23 ENCOUNTER — Other Ambulatory Visit: Payer: Self-pay

## 2024-07-23 ENCOUNTER — Encounter: Payer: Self-pay | Admitting: *Deleted

## 2024-07-23 ENCOUNTER — Ambulatory Visit: Admission: EM | Admit: 2024-07-23 | Discharge: 2024-07-23 | Disposition: A | Discharge status: Home / Self Care

## 2024-07-23 DIAGNOSIS — U071 COVID-19: Secondary | ICD-10-CM

## 2024-07-23 NOTE — Discharge Instructions (Signed)
 Supportive care at home (Ibuprofen , tylenol , mucinex, fluids and hydration, rest)  Please wear a mask, wash hands frequently, and keep your distance from others!
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
## 2024-07-23 NOTE — ED Triage Notes (Signed)
 Body aches and subjective fever this week. He had positive home covid yesterday. Taking tylenol  as needed.
